# Patient Record
Sex: Male | Born: 1960 | Race: White | Hispanic: No | Marital: Married | Smoking: Never smoker
Health system: Southern US, Community
[De-identification: ages and names within clinical notes are randomized; demographics above are authoritative.]

## PROBLEM LIST (undated history)

## (undated) DIAGNOSIS — E785 Hyperlipidemia, unspecified: Secondary | ICD-10-CM

## (undated) DIAGNOSIS — I10 Essential (primary) hypertension: Secondary | ICD-10-CM

---

## 2010-04-21 ENCOUNTER — Encounter: Payer: Self-pay | Admitting: Family Medicine

## 2010-04-21 ENCOUNTER — Ambulatory Visit: Payer: Self-pay | Admitting: Family Medicine

## 2010-04-21 DIAGNOSIS — I10 Essential (primary) hypertension: Secondary | ICD-10-CM | POA: Insufficient documentation

## 2010-04-21 DIAGNOSIS — E785 Hyperlipidemia, unspecified: Secondary | ICD-10-CM

## 2010-04-21 DIAGNOSIS — L255 Unspecified contact dermatitis due to plants, except food: Secondary | ICD-10-CM

## 2010-12-05 NOTE — Assessment & Plan Note (Signed)
Vital Signs:  Patient Profile:   50 Years Old Male CC:      Poison Ivy x 6 days Height:     72 inches Weight:      311 pounds O2 Sat:      97 % O2 treatment:    Room Air Temp:     98.3 degrees F oral Pulse rate:   98 / minute Pulse rhythm:   regular Resp:     18 per minute BP sitting:   158 / 103  (right arm) Cuff size:   large  Vitals Entered By: Emilio Math (April 21, 2010 6:49 PM)                  Current Allergies: No known allergies History of Present Illness Chief Complaint: Poison Ivy x 6 days History of Present Illness: Subjective:  Patient was trimming weeds 6 days ago, coming into contact with poison ivy.  He subsequently developed a pruritic rash on forearms, abdomen, and legs.  He feels well without systemic symptoms.  He states that his BP is normally about 135/85.  Current Meds NORVASC 10 MG TABS (AMLODIPINE BESYLATE)  LIPITOR 40 MG TABS (ATORVASTATIN CALCIUM)  PREDNISONE 10 MG TABS (PREDNISONE) 2 PO BID for 2 days, then 1 BID for 2 days, then 1 daily for 2 days.  Take PC  REVIEW OF SYSTEMS Constitutional Symptoms      Denies fever, chills, night sweats, weight loss, weight gain, and fatigue.  Eyes       Denies change in vision, eye pain, eye discharge, glasses, contact lenses, and eye surgery. Ear/Nose/Throat/Mouth       Denies hearing loss/aids, change in hearing, ear pain, ear discharge, dizziness, frequent runny nose, frequent nose bleeds, sinus problems, sore throat, hoarseness, and tooth pain or bleeding.  Respiratory       Denies dry cough, productive cough, wheezing, shortness of breath, asthma, bronchitis, and emphysema/COPD.  Cardiovascular       Denies murmurs, chest pain, and tires easily with exhertion.    Gastrointestinal       Denies stomach pain, nausea/vomiting, diarrhea, constipation, blood in bowel movements, and indigestion. Genitourniary       Denies painful urination, kidney stones, and loss of urinary control. Neurological     Denies paralysis, seizures, and fainting/blackouts. Musculoskeletal       Denies muscle pain, joint pain, joint stiffness, decreased range of motion, redness, swelling, muscle weakness, and gout.  Skin       Denies bruising, unusual mles/lumps or sores, and hair/skin or nail changes.  Psych       Denies mood changes, temper/anger issues, anxiety/stress, speech problems, depression, and sleep problems.  Past History:  Past Medical History: Hyperlipidemia Hypertension  Past Surgical History: Denies surgical history  Family History: Mother, HTN Father, Healthy  Social History: Non Smoker ETOH yes No DRugs [place]   Objective:  Obese middle aged male, alert and oriented  Skin:  scattered maculo-papular linear vesicular lesions on forearms.  There are numerous maculo-papular erythematous lesions on lower abdomen at waist.  There are a few lesions in popliteal areas and on ankles.  No evidence bacterial infection. Assessment New Problems: RHUS DERMATITIS (ICD-692.6) HYPERTENSION (ICD-401.9) HYPERLIPIDEMIA (ICD-272.4)   Plan New Medications/Changes: PREDNISONE 10 MG TABS (PREDNISONE) 2 PO BID for 2 days, then 1 BID for 2 days, then 1 daily for 2 days.  Take PC  #14 x 0, 04/21/2010, Donna Christen MD  New Orders: New Patient Level III [  99203] Depo- Medrol 80mg  [J1040] Planning Comments:   Depo-Medrol 80mg  IM.  Begin prednisone taper tomorrow. May take Benadryl for itching at bedtime.  Advised to avoid salt intake.  Check BP at home and follow-up with PCP if remains elevated.     The patient and/or caregiver has been counseled thoroughly with regard to medications prescribed including dosage, schedule, interactions, rationale for use, and possible side effects and they verbalize understanding.  Diagnoses and expected course of recovery discussed and will return if not improved as expected or if the condition worsens. Patient and/or caregiver verbalized understanding.    Prescriptions: PREDNISONE 10 MG TABS (PREDNISONE) 2 PO BID for 2 days, then 1 BID for 2 days, then 1 daily for 2 days.  Take PC  #14 x 0   Entered by:   Donna Christen MD   Authorized by:   Emilio Math   Signed by:   Donna Christen MD on 04/21/2010   Method used:   Print then Give to Patient   RxID:   [id number]   Medication Administration  Injection # 1:    Medication: Depo- Medrol 80mg     Route: IM    Site: RUOQ gluteus    Exp Date: 10/05/2010    Lot #: [id number]    Mfr: [employer]    Patient tolerated injection without complications    Given by: Emilio Math (April 21, 2010 7:08 PM)  Orders Added: 1)  New Patient Level III [99203] 2)  Depo- Medrol 80mg  [J1040]   Medication Administration  Injection # 1:    Medication: Depo- Medrol 80mg     Route: IM    Site: RUOQ gluteus    Exp Date: 10/05/2010    Lot #: [id number]    Mfr: [employer]    Patient tolerated injection without complications    Given by: Emilio Math (April 21, 2010 7:08 PM)  Orders Added: 1)  New Patient Level III [99203] 2)  Depo- Medrol 80mg  [J1040]

## 2014-03-19 ENCOUNTER — Emergency Department
Admission: EM | Admit: 2014-03-19 | Discharge: 2014-03-19 | Disposition: A | Discharge status: Home / Self Care | Payer: BC Managed Care – PPO | Source: Home / Self Care | Attending: Family Medicine | Admitting: Family Medicine

## 2014-03-19 ENCOUNTER — Encounter: Payer: Self-pay | Admitting: Emergency Medicine

## 2014-03-19 DIAGNOSIS — E785 Hyperlipidemia, unspecified: Secondary | ICD-10-CM | POA: Insufficient documentation

## 2014-03-19 DIAGNOSIS — I10 Essential (primary) hypertension: Secondary | ICD-10-CM | POA: Insufficient documentation

## 2014-03-19 DIAGNOSIS — L255 Unspecified contact dermatitis due to plants, except food: Secondary | ICD-10-CM

## 2014-03-19 DIAGNOSIS — B029 Zoster without complications: Secondary | ICD-10-CM

## 2014-03-19 HISTORY — DX: Hyperlipidemia, unspecified: E78.5

## 2014-03-19 HISTORY — DX: Essential (primary) hypertension: I10

## 2014-03-19 MED ORDER — PREDNISONE 20 MG PO TABS: 20.0000 mg | ORAL_TABLET | Freq: Two times a day (BID) | ORAL | Status: DC

## 2014-03-19 MED ORDER — VALACYCLOVIR HCL 1 G PO TABS: 1000.0000 mg | ORAL_TABLET | Freq: Three times a day (TID) | ORAL | Status: DC

## 2014-03-19 MED ORDER — METHYLPREDNISOLONE SODIUM SUCC 125 MG IJ SOLR
80.0000 mg | Freq: Once | INTRAMUSCULAR | Status: AC
  Administered 2014-03-19: 80 mg via INTRAMUSCULAR

## 2014-03-19 NOTE — ED Notes (Signed)
Mathew Williamson complains of a itchy rash on abdomen and legs for 3 days. He has had poison ivy before and he feels like this is poison ivy. He was working out in his yard with his shirt off last week.

## 2014-03-19 NOTE — ED Provider Notes (Signed)
CSN: [id number]     Arrival date & time 03/19/14  1724 History   First MD Initiated Contact with Patient 03/19/14 1743     Chief Complaint  Patient presents with  . Rash      HPI Comments: Patient reports that about two weeks ago he developed a painful vesicular rash on his mid-upper back at the base of his neck, and several lesions just inside the occipital hairline.  This rash has been gradually improving. Four days ago while doing yard work he contacted poison ivy, and has developed a pruritic rash on his abdomen and legs.  Patient is a 53 y.o. male presenting with rash. The history is provided by the patient.  Rash Pain location: upper back, scalp, abdomen and legs. Pain quality comment:  Itching Pain radiates to:  Does not radiate Pain severity:  Mild Onset quality:  Sudden Duration:  2 days Timing:  Constant Progression:  Unchanged Chronicity:  New Context comment:  Poison ivy Relieved by:  Nothing Worsened by:  Nothing tried Ineffective treatments:  None tried Associated symptoms: no chest pain, no chills, no cough, no fatigue, no fever, no nausea, no sore throat and no vomiting     Past Medical History  Diagnosis Date  . Hypertension   . Hyperlipidemia    History reviewed. No pertinent past surgical history. History reviewed. No pertinent family history. History  Substance Use Topics  . Smoking status: Never Smoker   . Smokeless tobacco: Never Used  . Alcohol Use: No    Review of Systems  Constitutional: Negative for fever, chills and fatigue.  HENT: Negative for sore throat.   Respiratory: Negative for cough.   Cardiovascular: Negative for chest pain.  Gastrointestinal: Negative for nausea and vomiting.  Skin: Positive for rash.  All other systems reviewed and are negative.   Allergies  Review of patient's allergies indicates no known allergies.  Home Medications   Prior to Admission medications   Not on File   BP 151/89  Pulse 78  Temp(Src) 98  F (36.7 C) (Oral)  Resp 17  Ht 5\' 11"  (1.803 m)  Wt 337 lb (152.862 kg)  BMI 47.02 kg/m2  SpO2 96% Physical Exam  Nursing note and vitals reviewed. Constitutional: He is oriented to person, place, and time. He appears well-developed and well-nourished. No distress.  Patient is obese (BMI 47.0)  HENT:  Head: Normocephalic and atraumatic.    Nose: Nose normal.  Mouth/Throat: Oropharynx is clear and moist.  Over the upper thoracic spine midline and within occipital scalp there is a resolving erythematous herpetiform eruption  as noted on diagram.    Eyes: Conjunctivae are normal. Pupils are equal, round, and reactive to light.  Neck: Neck supple.  Cardiovascular: Normal heart sounds.   Pulmonary/Chest: Breath sounds normal.  Abdominal: There is no tenderness.  Musculoskeletal: He exhibits no edema.  Lymphadenopathy:    He has no cervical adenopathy.  Neurological: He is alert and oriented to person, place, and time.  Skin: Skin is warm and dry. Rash noted. Rash is macular.     On patient's abdomen and arms are multiple erythematous macular and linear lesions  as noted on diagram.  No swelling or tenderness to palpation     ED Course  Procedures  none      MDM   1. Rhus dermatitis arms and abdomen   2. Herpes zoster upper back and scalp    Solumedrol 80mg  IM, then begin prednisone burst.  Begin Valtrex. May  take Benadryl for itching.  Start prednisone tomorrow (Saturday 03/20/14) Followup with dermatologist if not improving.    Lattie HawStephen A Davien Malone, MD 03/23/14 1324

## 2014-03-19 NOTE — Discharge Instructions (Signed)
May take Benadryl for itching.  Start prednisone tomorrow (Saturday 03/20/14)   Poison Ivy Poison ivy is a inflammation of the skin (contact dermatitis) caused by touching the allergens on the leaves of the ivy plant following previous exposure to the plant. The rash usually appears 48 hours after exposure. The rash is usually bumps (papules) or blisters (vesicles) in a linear pattern. Depending on your own sensitivity, the rash may simply cause redness and itching, or it may also progress to blisters which may break open. These must be well cared for to prevent secondary bacterial (germ) infection, followed by scarring. Keep any open areas dry, clean, dressed, and covered with an antibacterial ointment if needed. The eyes may also get puffy. The puffiness is worst in the morning and gets better as the day progresses. This dermatitis usually heals without scarring, within 2 to 3 weeks without treatment. HOME CARE INSTRUCTIONS  Thoroughly wash with soap and water as soon as you have been exposed to poison ivy. You have about one half hour to remove the plant resin before it will cause the rash. This washing will destroy the oil or antigen on the skin that is causing, or will cause, the rash. Be sure to wash under your fingernails as any plant resin there will continue to spread the rash. Do not rub skin vigorously when washing affected area. Poison ivy cannot spread if no oil from the plant remains on your body. A rash that has progressed to weeping sores will not spread the rash unless you have not washed thoroughly. It is also important to wash any clothes you have been wearing as these may carry active allergens. The rash will return if you wear the unwashed clothing, even several days later. Avoidance of the plant in the future is the best measure. Poison ivy plant can be recognized by the number of leaves. Generally, poison ivy has three leaves with flowering branches on a single stem. Diphenhydramine may  be purchased over the counter and used as needed for itching. Do not drive with this medication if it makes you drowsy.Ask your caregiver about medication for children. SEEK MEDICAL CARE IF:  Open sores develop.  Redness spreads beyond area of rash.  You notice purulent (pus-like) discharge.  You have increased pain.  Other signs of infection develop (such as fever). Document Released: 10/19/2000 Document Revised: 01/14/2012 Document Reviewed: 09/07/2009 [facility] Patient Information 2014 [employer], [place].   Shingles Shingles (herpes zoster) is an infection that is caused by the same virus that causes chickenpox (varicella). The infection causes a painful skin rash and fluid-filled blisters, which eventually break open, crust over, and heal. It may occur in any area of the body, but it usually affects only one side of the body or face. The pain of shingles usually lasts about 1 month. However, some people with shingles may develop long-term (chronic) pain in the affected area of the body. Shingles often occurs many years after the person had chickenpox. It is more common:  In people older than 50 years.  In people with weakened immune systems, such as those with HIV, AIDS, or cancer.  In people taking medicines that weaken the immune system, such as transplant medicines.  In people under great stress. CAUSES  Shingles is caused by the varicella zoster virus (VZV), which also causes chickenpox. After a person is infected with the virus, it can remain in the person's body for years in an inactive state (dormant). To cause shingles, the virus reactivates and  breaks out as an infection in a nerve root. The virus can be spread from person to person (contagious) through contact with open blisters of the shingles rash. It will only spread to people who have not had chickenpox. When these people are exposed to the virus, they may develop chickenpox. They will not develop shingles. Once the  blisters scab over, the person is no longer contagious and cannot spread the virus to others. SYMPTOMS  Shingles shows up in stages. The initial symptoms may be pain, itching, and tingling in an area of the skin. This pain is usually described as burning, stabbing, or throbbing.In a few days or weeks, a painful red rash will appear in the area where the pain, itching, and tingling were felt. The rash is usually on one side of the body in a band or belt-like pattern. Then, the rash usually turns into fluid-filled blisters. They will scab over and dry up in approximately 2 3 weeks. Flu-like symptoms may also occur with the initial symptoms, the rash, or the blisters. These may include:  Fever.  Chills.  Headache.  Upset stomach. DIAGNOSIS  Your caregiver will perform a skin exam to diagnose shingles. Skin scrapings or fluid samples may also be taken from the blisters. This sample will be examined under a microscope or sent to a lab for further testing. TREATMENT  There is no specific cure for shingles. Your caregiver will likely prescribe medicines to help you manage the pain, recover faster, and avoid long-term problems. This may include antiviral drugs, anti-inflammatory drugs, and pain medicines. HOME CARE INSTRUCTIONS   Take a cool bath or apply cool compresses to the area of the rash or blisters as directed. This may help with the pain and itching.   Only take over-the-counter or prescription medicines as directed by your caregiver.   Rest as directed by your caregiver.  Keep your rash and blisters clean with mild soap and cool water or as directed by your caregiver.  Do not pick your blisters or scratch your rash. Apply an anti-itch cream or numbing creams to the affected area as directed by your caregiver.  Keep your shingles rash covered with a loose bandage (dressing).  Avoid skin contact with:  Babies.   Pregnant women.   Children with eczema.   Elderly people  with transplants.   People with chronic illnesses, such as leukemia or AIDS.   Wear loose-fitting clothing to help ease the pain of material rubbing against the rash.  Keep all follow-up appointments with your caregiver.If the area involved is on your face, you may receive a referral for follow-up to a specialist, such as an eye doctor (ophthalmologist) or an ear, nose, and throat (ENT) doctor. Keeping all follow-up appointments will help you avoid eye complications, chronic pain, or disability.  SEEK IMMEDIATE MEDICAL CARE IF:   You have facial pain, pain around the eye area, or loss of feeling on one side of your face.  You have ear pain or ringing in your ear.  You have loss of taste.  Your pain is not relieved with prescribed medicines.   Your redness or swelling spreads.   You have more pain and swelling.  Your condition is worsening or has changed.   You have a feveror persistent symptoms for more than 2 3 days.  You have a fever and your symptoms suddenly get worse. MAKE SURE YOU:  Understand these instructions.  Will watch your condition.  Will get help right away if you are  not doing well or get worse. Document Released: 10/22/2005 Document Revised: 07/16/2012 Document Reviewed: 06/05/2012 [facility] Patient Information 2014 [employer], [place].

## 2017-04-22 ENCOUNTER — Emergency Department (INDEPENDENT_AMBULATORY_CARE_PROVIDER_SITE_OTHER): Payer: BLUE CROSS/BLUE SHIELD

## 2017-04-22 ENCOUNTER — Emergency Department
Admission: EM | Admit: 2017-04-22 | Discharge: 2017-04-22 | Disposition: A | Discharge status: Home / Self Care | Payer: BLUE CROSS/BLUE SHIELD | Source: Home / Self Care | Attending: Family Medicine | Admitting: Family Medicine

## 2017-04-22 ENCOUNTER — Encounter: Payer: Self-pay | Admitting: *Deleted

## 2017-04-22 DIAGNOSIS — Z23 Encounter for immunization: Secondary | ICD-10-CM

## 2017-04-22 DIAGNOSIS — L03211 Cellulitis of face: Secondary | ICD-10-CM | POA: Diagnosis not present

## 2017-04-22 DIAGNOSIS — R220 Localized swelling, mass and lump, head: Secondary | ICD-10-CM

## 2017-04-22 MED ORDER — TETANUS-DIPHTH-ACELL PERTUSSIS 5-2.5-18.5 LF-MCG/0.5 IM SUSP
0.5000 mL | Freq: Once | INTRAMUSCULAR | Status: AC
  Administered 2017-04-22: 0.5 mL via INTRAMUSCULAR

## 2017-04-22 MED ORDER — CLINDAMYCIN HCL 300 MG PO CAPS: ORAL_CAPSULE | ORAL | 0 refills | Status: AC

## 2017-04-22 NOTE — Discharge Instructions (Signed)
Change bandage daily until healed. If symptoms become significantly worse during the night or over the weekend, proceed to the local emergency room.

## 2017-04-22 NOTE — ED Triage Notes (Signed)
Pt reports that one week ago he was putting away some lumber at home when a piece fell and hit his LT cheek. 2 days ago the wound became inflamed and has drainage. Denies fever or pain.

## 2017-04-22 NOTE — ED Provider Notes (Signed)
[facility]    CSN: [id number] Arrival date & time: 04/22/17  1213     History   Chief Complaint Chief Complaint  Patient presents with  . Abscess    HPI Mathew Williamson is a 56 y.o. male.   Six days ago while working in his garage, a piece of 4X4 lumber stored on an overhead shelf fell and hit his left cheek.  The end of the lumber had a stapled tag in place that lacerated his cheek.  His initial swelling improved until yesterday when he developed redness, increased swelling, and purulent drainage.  He states that he has had minimal pain.  No fevers, chills, and sweats.  No pain with eye movement. Last Tdap 10/06/2007 ([facility]).   The history is provided by the patient.  Abscess  Location:  Face Facial abscess location:  L cheek Size:  1cm Abscess quality: painful, redness and weeping   Abscess quality: no fluctuance and no itching   Duration:  6 days Progression:  Worsening Pain details:    Quality:  Dull   Severity:  Mild   Duration:  6 days   Timing:  Constant   Progression:  Unchanged Chronicity:  New Context: skin injury   Relieved by:  Nothing Worsened by:  Cold compresses Ineffective treatments:  None tried Associated symptoms: nausea   Associated symptoms: no fatigue, no fever and no headaches   Risk factors: no family hx of MRSA     Past Medical History:  Diagnosis Date  . Hyperlipidemia   . Hypertension     Patient Active Problem List   Diagnosis Date Noted  . Hypertension   . Hyperlipidemia   . HYPERLIPIDEMIA 04/21/2010  . HYPERTENSION 04/21/2010  . RHUS DERMATITIS 04/21/2010    History reviewed. No pertinent surgical history.     Home Medications    Prior to Admission medications   Medication Sig Start Date End Date Taking? Authorizing Provider  amLODipine (NORVASC) 5 MG tablet Take 5 mg by mouth daily.   Yes [provider]  atorvastatin (LIPITOR) 40 MG tablet Take 40 mg by mouth daily.   Yes [provider]  clindamycin (CLEOCIN) 300 MG capsule Take one cap by mouth every 8 hours. 04/22/17   Lattie Haw, MD    Family History Family History  Problem Relation Age of Onset  . Cancer Mother        breast  . Hypertension Mother   . Leukemia Father     Social History Social History  Substance Use Topics  . Smoking status: Never Smoker  . Smokeless tobacco: Never Used  . Alcohol use No     Allergies   Patient has no known allergies.   Review of Systems Review of Systems  Constitutional: Negative for fatigue and fever.  Gastrointestinal: Positive for nausea.  Neurological: Negative for headaches.     Physical Exam Triage Vital Signs ED Triage Vitals  Enc Vitals Group     BP 04/22/17 1234 (!) 177/113     Pulse Rate 04/22/17 1234 87     Resp 04/22/17 1234 18     Temp 04/22/17 1234 98.4 F (36.9 C)     Temp Source 04/22/17 1234 Oral     SpO2 04/22/17 1234 95 %     Weight 04/22/17 1235 (!) 339 lb (153.8 kg)     Height 04/22/17 1235 5\' 11"  (1.803 m)     Head Circumference --      Peak Flow --  Pain Score 04/22/17 1235 0     Pain Loc --      Pain Edu? --      Excl. in GC? --    No data found.   Updated Vital Signs BP (!) 177/113 (BP Location: Left Arm)   Pulse 87   Temp 98.4 F (36.9 C) (Oral)   Resp 18   Ht 5\' 11"  (1.803 m)   Wt (!) 339 lb (153.8 kg)   SpO2 95%   BMI 47.28 kg/m   Visual Acuity Right Eye Distance:   Left Eye Distance:   Bilateral Distance:    Right Eye Near:   Left Eye Near:    Bilateral Near:     Physical Exam  Constitutional: He appears well-developed and well-nourished.  HENT:  Head: Head is with laceration.    Right Ear: External ear normal.  Left Ear: External ear normal.  Nose: Nose normal.  Mild swelling, erythema, and tenderness around small unhealed laceration with purulent drainage as noted on diagram.  Area not fluctuant.  Eyes: Conjunctivae and EOM are normal. Pupils are equal, round, and reactive to light.   Neck: Neck supple.  Cardiovascular: Normal rate.   Pulmonary/Chest: Effort normal.  Musculoskeletal: He exhibits no edema.  Lymphadenopathy:    He has no cervical adenopathy.  Neurological: He is alert.  Skin: Skin is warm and dry.  Nursing note and vitals reviewed.    UC Treatments / Results  Labs (all labs ordered are listed, but only abnormal results are displayed) Labs Reviewed  WOUND CULTURE    EKG  EKG Interpretation None       Radiology Dg Facial Bones Complete  Result Date: 04/22/2017 CLINICAL DATA:  Left facial swelling EXAM: FACIAL BONES COMPLETE 3+V COMPARISON:  None. FINDINGS: There is no evidence of fracture or other significant bone abnormality. No orbital emphysema or sinus air-fluid levels are seen. IMPRESSION: Negative. Electronically Signed   By: Charline BillsSriyesh  Krishnan M.D.   On: 04/22/2017 13:26    Procedures Procedures (including critical care time)  Medications Ordered in UC Medications  Tdap (BOOSTRIX) injection 0.5 mL (0.5 mLs Intramuscular Given 04/22/17 1247)     Initial Impression / Assessment and Plan / UC Course  I have reviewed the triage vital signs and the nursing notes.  Pertinent labs & imaging results that were available during my care of the patient were reviewed by me and considered in my medical decision making (see chart for details).    Administered Tdap. Wound culture pending.  Begin Clindamycin 300mg  TID. Change bandage daily until healed. If symptoms become significantly worse during the night or over the weekend, proceed to the local emergency room.  Followup with Family Doctor if not improved in 3 to 4 days.    Final Clinical Impressions(s) / UC Diagnoses   Final diagnoses:  Cellulitis, face    New Prescriptions New Prescriptions   CLINDAMYCIN (CLEOCIN) 300 MG CAPSULE    Take one cap by mouth every 8 hours.     Lattie HawBeese, Elgie Maziarz A, MD 04/22/17 [phone]

## 2017-04-25 ENCOUNTER — Telehealth: Payer: Self-pay | Admitting: *Deleted

## 2017-04-25 LAB — WOUND CULTURE
Gram Stain: NONE SEEN
Gram Stain: NONE SEEN

## 2017-04-25 NOTE — Telephone Encounter (Signed)
Callback: Patient reports wound on face is much improved. Wound culture given and discussed.

## 2018-02-03 IMAGING — DX DG FACIAL BONES COMPLETE 3+V
5 series · 5 of 5 positions shown · non-contrast
Comparison: None.

CLINICAL DATA: Left facial swelling

EXAM:
FACIAL BONES COMPLETE 3+V

[facial pa townes (1 of 2)]
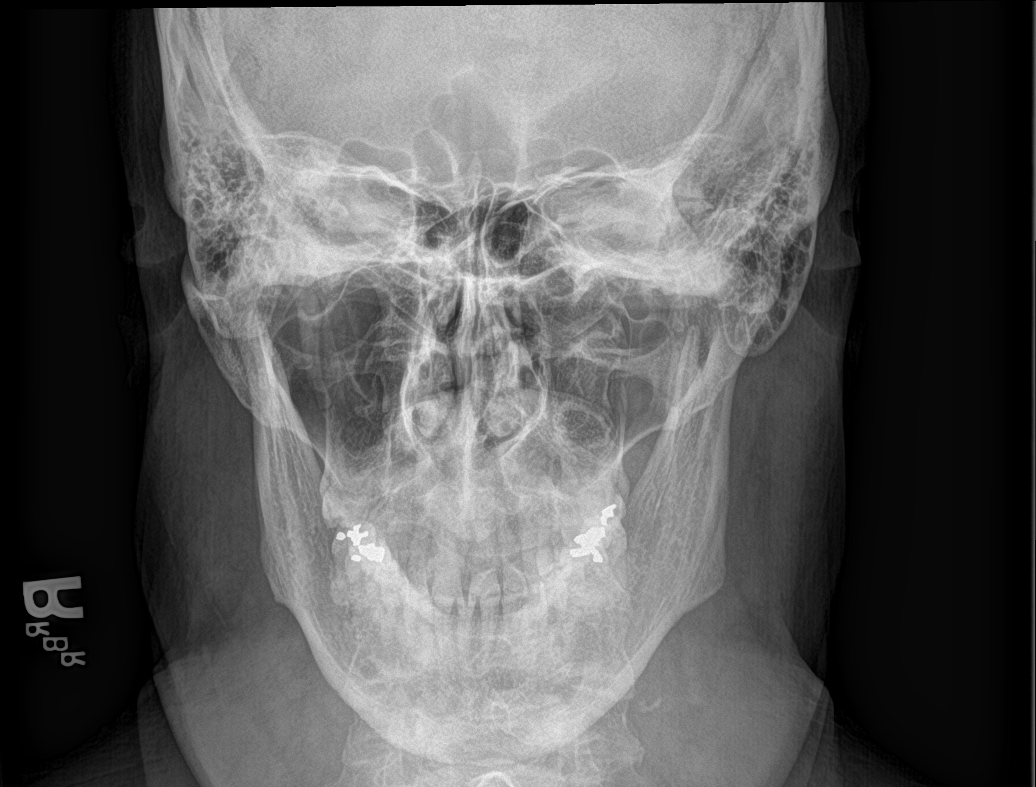

[facial waters]
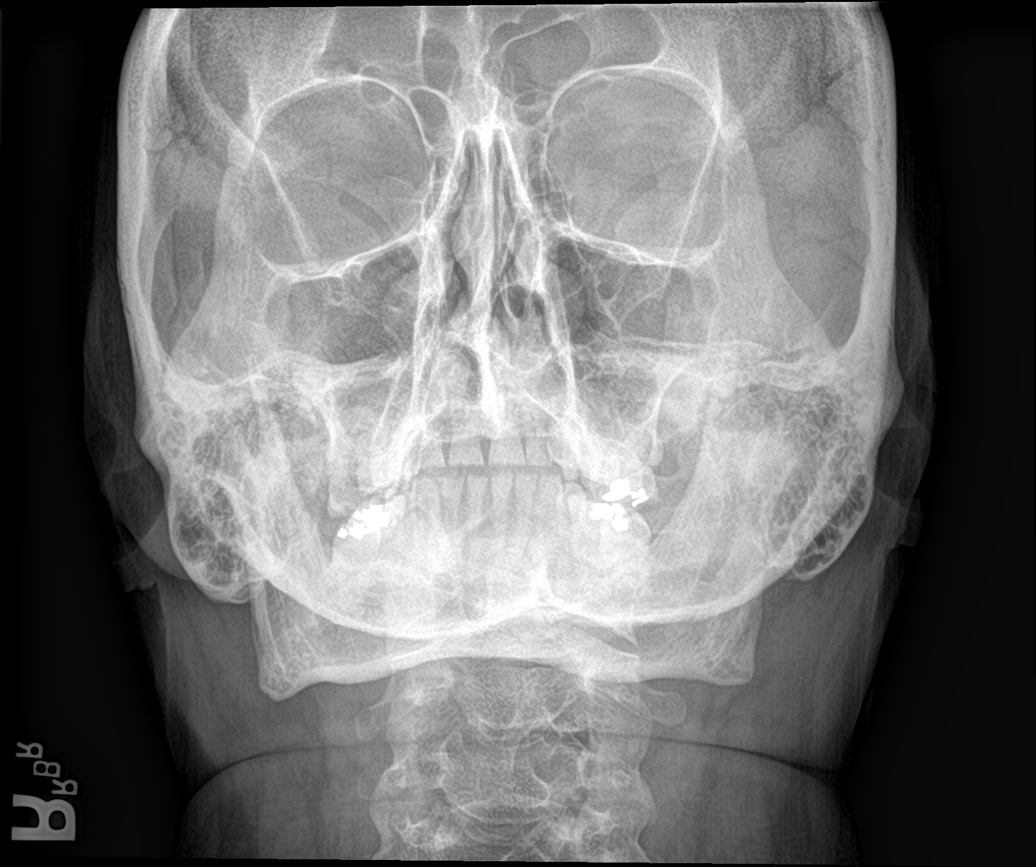

[facial lateral]
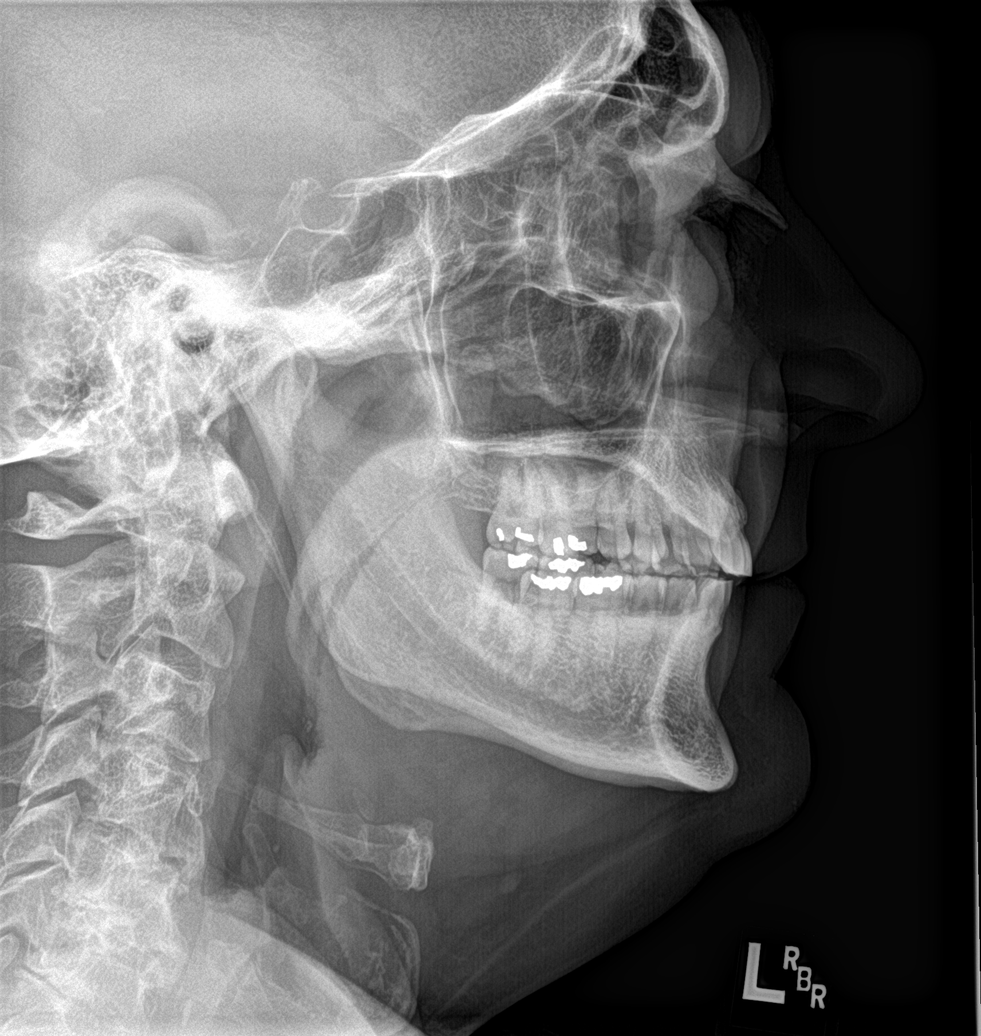

[facial smv]
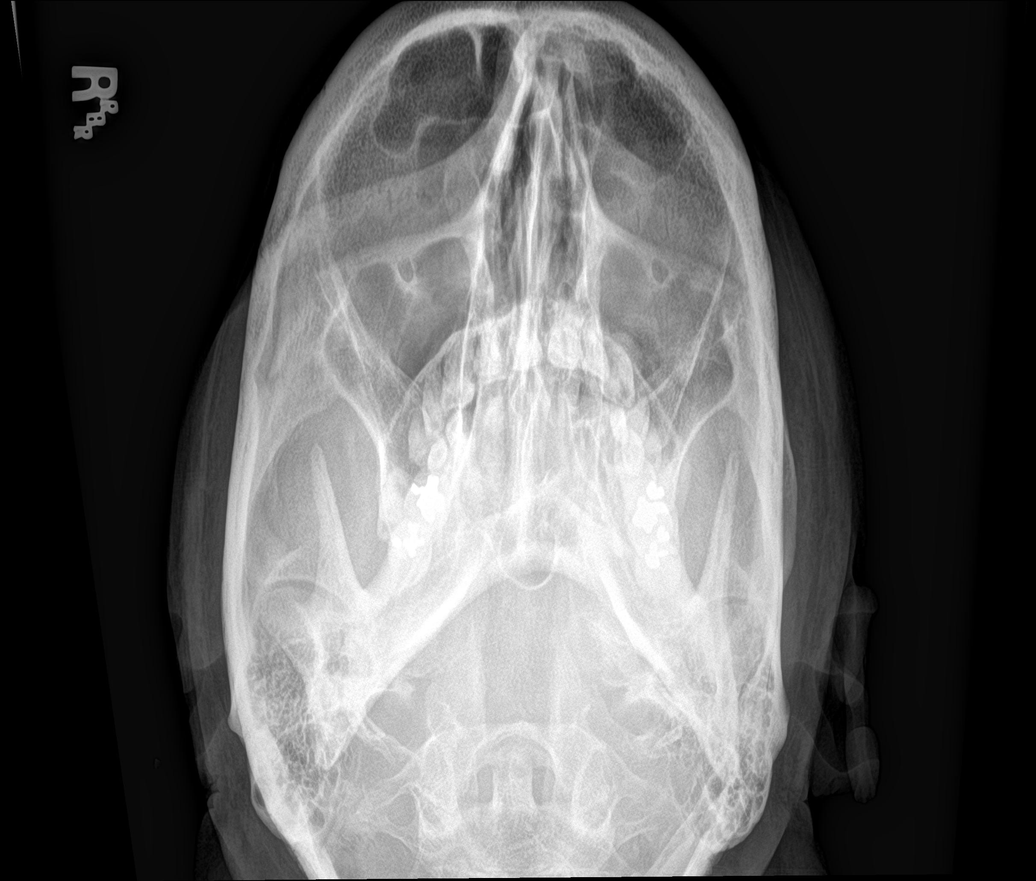

[facial pa townes (2 of 2)]
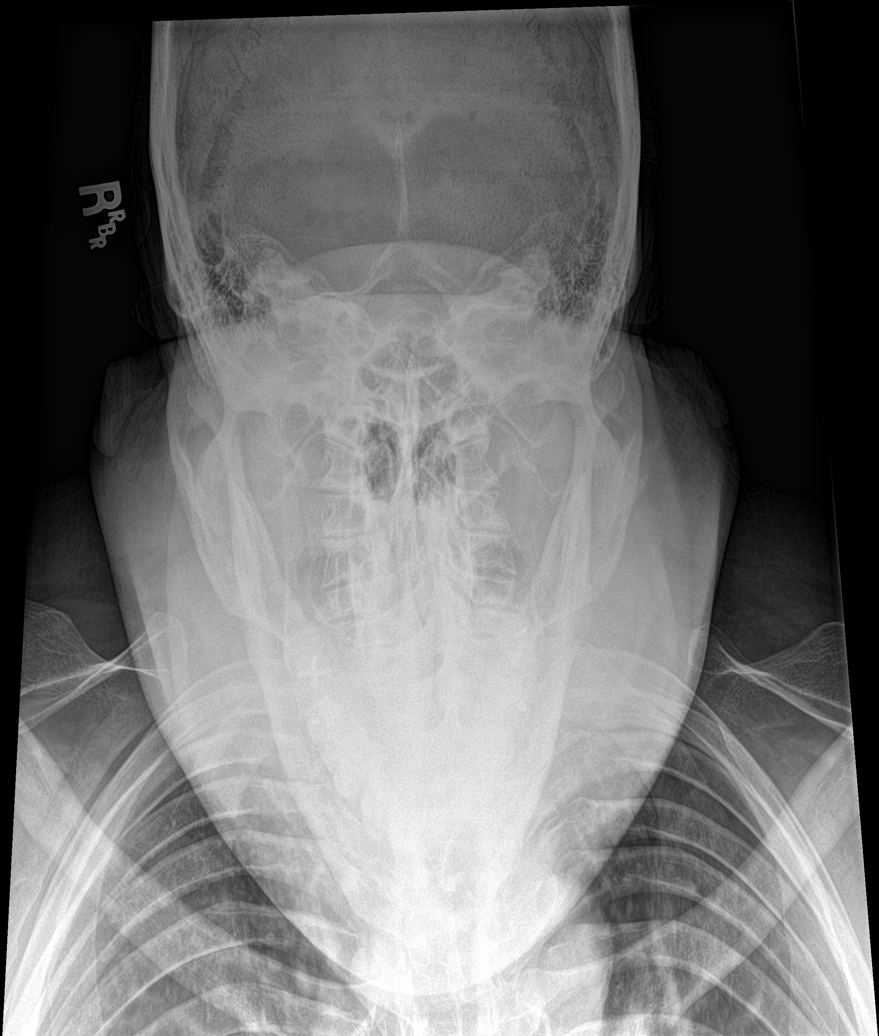

[5 of 5 positions shown; findings below may reference images not displayed]

FINDINGS: There is no evidence of fracture or other significant bone
abnormality.

No orbital emphysema or sinus air-fluid levels are seen.
IMPRESSION: Negative.
